# Patient Record
Sex: Male | Born: 1935 | Race: White | Hispanic: No | Marital: Married | Smoking: Current every day smoker
Health system: Southern US, Community
[De-identification: ages and names within clinical notes are randomized; demographics above are authoritative.]

## PROBLEM LIST (undated history)

## (undated) DIAGNOSIS — I2510 Atherosclerotic heart disease of native coronary artery without angina pectoris: Secondary | ICD-10-CM

## (undated) DIAGNOSIS — E119 Type 2 diabetes mellitus without complications: Secondary | ICD-10-CM

## (undated) DIAGNOSIS — E785 Hyperlipidemia, unspecified: Secondary | ICD-10-CM

## (undated) DIAGNOSIS — I10 Essential (primary) hypertension: Secondary | ICD-10-CM

## (undated) HISTORY — DX: Essential (primary) hypertension: I10

## (undated) HISTORY — DX: Type 2 diabetes mellitus without complications: E11.9

## (undated) HISTORY — DX: Hyperlipidemia, unspecified: E78.5

## (undated) HISTORY — DX: Atherosclerotic heart disease of native coronary artery without angina pectoris: I25.10

## (undated) HISTORY — PX: APPENDECTOMY: SHX54

## (undated) HISTORY — PX: CORONARY ARTERY BYPASS GRAFT: SHX141

---

## 1997-09-18 ENCOUNTER — Encounter (HOSPITAL_COMMUNITY): Admission: RE | Admit: 1997-09-18 | Discharge: 1997-12-17 | Payer: Self-pay | Admitting: Cardiovascular Disease

## 1997-12-16 ENCOUNTER — Encounter (HOSPITAL_COMMUNITY): Admission: RE | Admit: 1997-12-16 | Discharge: 1998-03-16 | Payer: Self-pay | Admitting: Cardiovascular Disease

## 1998-03-18 ENCOUNTER — Encounter (HOSPITAL_COMMUNITY): Admission: RE | Admit: 1998-03-18 | Discharge: 1998-06-16 | Payer: Self-pay | Admitting: Cardiovascular Disease

## 1998-06-17 ENCOUNTER — Encounter (HOSPITAL_COMMUNITY): Admission: RE | Admit: 1998-06-17 | Discharge: 1998-09-15 | Payer: Self-pay | Admitting: Cardiovascular Disease

## 1998-09-16 ENCOUNTER — Encounter (HOSPITAL_COMMUNITY): Admission: RE | Admit: 1998-09-16 | Discharge: 1998-12-15 | Payer: Self-pay | Admitting: Cardiovascular Disease

## 1998-12-16 ENCOUNTER — Encounter (HOSPITAL_COMMUNITY): Admission: RE | Admit: 1998-12-16 | Discharge: 1999-01-16 | Payer: Self-pay | Admitting: Cardiovascular Disease

## 1999-01-17 ENCOUNTER — Encounter (HOSPITAL_COMMUNITY): Admission: RE | Admit: 1999-01-17 | Discharge: 1999-04-17 | Payer: Self-pay | Admitting: Cardiovascular Disease

## 2001-12-14 ENCOUNTER — Encounter: Payer: Self-pay | Admitting: Emergency Medicine

## 2001-12-14 ENCOUNTER — Emergency Department (HOSPITAL_COMMUNITY): Admission: EM | Admit: 2001-12-14 | Discharge: 2001-12-14 | Payer: Self-pay | Admitting: Emergency Medicine

## 2004-06-21 ENCOUNTER — Ambulatory Visit (HOSPITAL_COMMUNITY): Admission: RE | Admit: 2004-06-21 | Discharge: 2004-06-21 | Payer: Self-pay | Admitting: Gastroenterology

## 2004-08-29 ENCOUNTER — Encounter: Admission: RE | Admit: 2004-08-29 | Discharge: 2004-08-29 | Payer: Self-pay | Admitting: General Surgery

## 2004-08-31 ENCOUNTER — Ambulatory Visit (HOSPITAL_COMMUNITY): Admission: RE | Admit: 2004-08-31 | Discharge: 2004-08-31 | Payer: Self-pay | Admitting: General Surgery

## 2004-08-31 ENCOUNTER — Ambulatory Visit (HOSPITAL_BASED_OUTPATIENT_CLINIC_OR_DEPARTMENT_OTHER): Admission: RE | Admit: 2004-08-31 | Discharge: 2004-08-31 | Payer: Self-pay | Admitting: General Surgery

## 2008-07-07 ENCOUNTER — Encounter: Admission: RE | Admit: 2008-07-07 | Discharge: 2008-07-07 | Payer: Self-pay | Admitting: Family Medicine

## 2009-03-08 ENCOUNTER — Emergency Department (HOSPITAL_COMMUNITY): Admission: EM | Admit: 2009-03-08 | Discharge: 2009-03-09 | Payer: Self-pay | Admitting: Emergency Medicine

## 2010-11-03 NOTE — Op Note (Signed)
NAMEAZLAN, HANWAY                 ACCOUNT NO.:  [id number]   MEDICAL RECORD NO.:  [id number]          PATIENT TYPE:  AMB   LOCATION:  ENDO                         FACILITY:  [facility]   PHYSICIAN:  Graylin Shiver, M.D.   DATE OF BIRTH:  12/16/1933   DATE OF PROCEDURE:  06/21/2004  DATE OF DISCHARGE:                                 OPERATIVE REPORT   PROCEDURE:  Colonoscopy.   ENDOSCOPIST:  Graylin Shiver, M.D.   INDICATIONS FOR PROCEDURE:  Screening.   INFORMED CONSENT:  An informed consent was obtained after the explanation of  the risks of bleeding, infection and perforation.   PREMEDICATION:  Fentanyl 45 mcg IV, Versed 4.5 mg IV.   DESCRIPTION OF PROCEDURE:  With the patient in the left lateral decubitus  position, a rectal examination was performed.  No masses were felt.  The  Olympus colonoscope was inserted into the rectum and advanced around the  colon to the cecum.  The cecal landmarks were identified.  The cecum and  ascending colon were normal.  The transverse colon showed an occasional  diverticulum.  The descending colon and sigmoid showed moderate  diverticulosis.  The rectum was normal.   He tolerated the procedure well without complications.   IMPRESSION:  Diverticulosis   RECOMMENDATIONS:  I would recommend a follow-up screening colonoscopy in 10  years.       SFG/MEDQ  D:  06/21/2004  T:  06/21/2004  Job:  [id number]   cc:   Donia Guiles, M.D.  [address] [place]  [place] [address]  Fax: [phone]

## 2010-11-03 NOTE — Op Note (Signed)
Juan Duncan, Juan Duncan                 ACCOUNT NO.:  [id number]   MEDICAL RECORD NO.:  [id number]          PATIENT TYPE:  AMB   LOCATION:  DSC                          FACILITY:  [facility]   PHYSICIAN:  Leonie Man, M.D.   DATE OF BIRTH:  29-Sep-1933   DATE OF PROCEDURE:  08/31/2004  DATE OF DISCHARGE:                                 OPERATIVE REPORT   PREOPERATIVE DIAGNOSIS:  Right inguinal hernia.   POSTOPERATIVE DIAGNOSIS:  Right inguinal hernia.   PROCEDURE:  Right inguinal hernia repair with mesh.   SURGEON:  Leonie Man, M.D.   ASSISTANT:  Nurse.   ANESTHESIA:  General.   INDICATIONS FOR PROCEDURE:  [name] is a 77 year old man presenting with  right-sided groin pain and noted to have a large right inguinal hernia,  referred by his physician for evaluation and repair.  The risks and  potential benefits of surgery have been discussed with the patient.  He  understands these and gives his consent to surgery.   PROCEDURE:  Following the induction of satisfactory general anesthesia, with  the patient positioned supinely.  The right groin is prepped and draped to  be included in a sterile operative field.  The groin crease infiltrated with  0.5% Marcaine with epinephrine and transverse incision made in the lower  groin crease, deepened through the skin and subcutaneous tissue, dissecting  down to the external oblique aponeurosis.  This opened up through the  external inguinal ring with protection of the ilioinguinal nerve.  Spermatic  cord was elevated and held with a Penrose drain.  There was no indirect  hernia noted.  A large direct hernia was noted in the rectus space and this  was repaired with a patch of polypropylene mesh and was sewn in at the pubic  tubercle, carried up along conjoined tendon to the internal ring with 2-0  Novofil suture and again from the pubic tubercle carried up along the  shelving edge of Poupart's ligament to the internal ring with a 2-0  Novofil  suture.  The tails of the mesh were split so as to allow normal protrusion  of the cord.  The tails of the mesh were then sutured down into the internal  oblique muscles behind the cord.  This creates a new internal ring which was  not constricting off the cord.  All areas of dissection were then checked  for hemostasis and noted to be dry.  Sponge and instrument counts were  verified.  External oblique aponeurosis closed over the cord with a running  2-0 Vicryl suture.  The Scarpa's fascia closed with a  running 3-0 Vicryl suture and skin closed with running 4-0 Monocryl suture  and then reinforced with Steri-Strips.  Sterile dressings applied.  Anesthetic reversed.  The patient removed from the operating room to the  recovery room in stable condition, tolerating the procedure well.      PB/MEDQ  D:  08/31/2004  T:  08/31/2004  Job:  [id number]   cc:   Donia Guiles, M.D.  [address] [place]  [place] [address]  Fax: [phone]

## 2010-11-03 NOTE — Consult Note (Signed)
Juan Duncan, Juan Duncan                 ACCOUNT NO.:  [id number]   MEDICAL RECORD NO.:  [id number]          PATIENT TYPE:  AMB   LOCATION:  DSC                          FACILITY:  [facility]   PHYSICIAN:  Leonie Man, M.D.   DATE OF BIRTH:  1933-08-16   DATE OF CONSULTATION:  08/31/2004  DATE OF DISCHARGE:                                   CONSULTATION   PROBLEM:  Right inguinal hernia.   The patient is a 78 year old retired man who is requested to be seen by his  primary care physician for a large right-sided inguinal hernia.  The patient  has no history of chronic constipation, no bladder neck obstruction, and  does not do any heavy lifting.  He noted the hernia after he was active and  noted a very large mass in his groin.  He comes in for evaluation and  treatment.   MEDICATIONS:  1.  Toprol XL 50 mg.  2.  Tricor 145 mg daily.  3.  Vytorin 10/40 mg daily.  4.  Aspirin 81 mg daily.   DRUG ALLERGIES:  No known drug allergies.   SURGERY:  1.  The patient has had CABG x 6 back in 1994.  2.  He has also had an appendectomy in the past.  3.  He had a split-thickness skin graft to his nose.   SOCIAL HISTORY:  The patient is a retired married man.  No tobacco use, no  alcohol use.   FAMILY HISTORY:  Mother and father are deceased.  Father died from heart  disease, otherwise unspecified.  His mother died from pancreatic cancer.  He  did have a brother who had an accidental death secondary to driving while  intoxicated.   REVIEW OF SYSTEMS:  Negative in detail, except for some history of reflux  esophagitis.  He is not currently on any antireflux medications.  He has had  a skin cancer removed from the bridge of his nose with a split-thickness  skin graft to this area.  He does complain of some arthritis.   PHYSICAL EXAMINATION:  GENERAL:  He is a well-developed, pleasant gentleman.  BLOOD PRESSURE:  134/72.  HEENT:  Head normocephalic.  Pupils round, regular.  No nasal  obstruction.  There is a well-healed split-thickness skin graft over the bridge of his  nose.  Oropharynx is benign.  NECK:  Supple.  No thyromegaly or adenopathy.  CHEST:  Clear to auscultation.  HEART:  Regular rate and rhythm, without murmurs or gallop rhythms.  ABDOMEN:  Soft, nontender, nondistended.  There is a rather large right-  sided inguinal hernia noted.  EXTREMITIES:  Range of motion within normal limits.  No clubbing, cyanosis,  or edema.  NEUROLOGIC:  Shows the patient to be alert and oriented x 3, moving all four  extremities, without any gross sensory deficits.   ASSESSMENT:  1.  Right inguinal hernia.  2.  Coronary artery disease, status post coronary artery bypass graft,      stable.  3.  Hypertension.   PLAN:  For repair of his right inguinal hernia with  mesh.  The risks and  potential benefits of surgery have been discussed with the patient.  All  questions answered and consent obtained.   NOTE:  [facility] medical record number for this patient is  [phone].      PB/MEDQ  D:  08/31/2004  T:  08/31/2004  Job:  [id number]

## 2011-06-19 HISTORY — PX: NM MYOVIEW LTD: HXRAD82

## 2012-10-13 ENCOUNTER — Other Ambulatory Visit (HOSPITAL_COMMUNITY): Payer: Self-pay | Admitting: Cardiovascular Disease

## 2012-10-13 DIAGNOSIS — I25810 Atherosclerosis of coronary artery bypass graft(s) without angina pectoris: Secondary | ICD-10-CM

## 2012-11-04 ENCOUNTER — Encounter (HOSPITAL_COMMUNITY): Payer: Self-pay | Admitting: Cardiovascular Disease

## 2012-11-20 ENCOUNTER — Encounter (HOSPITAL_COMMUNITY): Payer: Self-pay

## 2013-01-31 ENCOUNTER — Other Ambulatory Visit (HOSPITAL_COMMUNITY): Payer: Self-pay | Admitting: Cardiovascular Disease

## 2013-02-02 NOTE — Telephone Encounter (Signed)
Rx was sent to pharmacy electronically. 

## 2013-02-15 ENCOUNTER — Other Ambulatory Visit (HOSPITAL_COMMUNITY): Payer: Self-pay | Admitting: Cardiovascular Disease

## 2013-02-17 NOTE — Telephone Encounter (Signed)
Rx was sent to pharmacy electronically. 

## 2013-09-18 ENCOUNTER — Encounter (HOSPITAL_COMMUNITY): Payer: Self-pay | Admitting: *Deleted

## 2013-10-01 ENCOUNTER — Telehealth (HOSPITAL_COMMUNITY): Payer: Self-pay

## 2013-10-06 ENCOUNTER — Ambulatory Visit (HOSPITAL_COMMUNITY)
Admission: RE | Admit: 2013-10-06 | Discharge: 2013-10-06 | Disposition: A | Discharge status: Home / Self Care | Payer: Medicare Other | Source: Ambulatory Visit | Attending: Cardiology | Admitting: Cardiology

## 2013-10-06 DIAGNOSIS — I25810 Atherosclerosis of coronary artery bypass graft(s) without angina pectoris: Secondary | ICD-10-CM | POA: Insufficient documentation

## 2013-10-06 MED ORDER — REGADENOSON 0.4 MG/5ML IV SOLN
0.4000 mg | Freq: Once | INTRAVENOUS | Status: AC
  Administered 2013-10-06: 0.4 mg via INTRAVENOUS

## 2013-10-06 MED ORDER — TECHNETIUM TC 99M SESTAMIBI GENERIC - CARDIOLITE
30.0000 | Freq: Once | INTRAVENOUS | Status: AC | PRN
  Administered 2013-10-06: 30 via INTRAVENOUS

## 2013-10-06 MED ORDER — TECHNETIUM TC 99M SESTAMIBI GENERIC - CARDIOLITE
10.0000 | Freq: Once | INTRAVENOUS | Status: AC | PRN
  Administered 2013-10-06: 10 via INTRAVENOUS

## 2013-10-06 NOTE — Procedures (Addendum)
[facility] [address] [place] [address] [phone]  Cardiology Nuclear Med Study  Juan BarthelGrover C Wenzler is a 80 y.o. male     MRN : [id number]     DOB: 07-Apr-1934  Procedure Date: 10/06/2013  Nuclear Med Background Indication for Stress Test:  Graft Patency and Stent Patency History:  CAD;CABG X6;STENT/PTCA;Last NUC MPI study on 10/03/2011-nonischemic;EF=64% Cardiac Risk Factors: Family History - CAD, Hypertension, LBBB, Lipids, NIDDM and Smoker  Symptoms:  Pt denies symptoms at this time.   Nuclear Pre-Procedure Caffeine/Decaff Intake:  8:00pm NPO After: 6:00am   IV Site: R Hand  IV 0.9% NS with Angio Cath:  22g  Chest Size (in):  36"  IV Started by: Berdie OgrenAmanda Wease, RN  Height: 5' (1.524 m)  Cup Size: n/a  BMI:  Body mass index is 28.51 kg/(m^2). Weight:  146 lb (66.225 kg)   Tech Comments:  n/a    Nuclear Med Study 1 or 2 day study: 1 day  Stress Test Type:  Lexiscan  Order Authorizing Provider:  Nanetta BattyJonathan Berry, MD   Resting Radionuclide: Technetium 6871m Sestamibi  Resting Radionuclide Dose: 10.6 mCi   Stress Radionuclide:  Technetium 7571m Sestamibi  Stress Radionuclide Dose: 30.2 mCi           Stress Protocol Rest HR: 51 Stress HR: 73  Rest BP: 167/79 Stress BP: 153/80  Exercise Time (min): n/a METS: n/a   Predicted Max HR: 143 bpm % Max HR: 59.44 bpm Rate Pressure Product: [id number]  Dose of Adenosine (mg):  n/a Dose of Lexiscan: 0.4 mg  Dose of Atropine (mg): n/a Dose of Dobutamine: n/a mcg/kg/min (at max HR)  Stress Test Technologist: Esperanza Sheetserry-Marie Martin, CCT Nuclear Technologist: Koren Shiverobin Moffitt, CNMT   Rest Procedure:  Myocardial perfusion imaging was performed at rest 45 minutes following the intravenous administration of Technetium 6071m Sestamibi. Stress Procedure:  The patient received IV Lexiscan 0.4 mg over 15-seconds.  Technetium 5071m Sestamibi injected IV at 30-seconds.  There were no significant changes  with Lexiscan.  Quantitative spect images were obtained after a 45 minute delay.  Transient Ischemic Dilatation (Normal <1.22):  1.02 Lung/Heart Ratio (Normal <0.45):  0.31 QGS EDV:  54 ml QGS ESV:  14 ml LV Ejection Fraction: 73%       Rest ECG: NSR-LBBB, nonspecific T wave changes  Stress ECG: No significant ST segment change suggestive of ischemia.  QPS Raw Data Images:  Mild diaphragmatic attenuation.  Normal left ventricular size. Stress Images:  There is decreased uptake in the inferior wall.  Rest Images:  Comparison with the stress images reveals partial improvement in the inferior wall perfusion. Subtraction (SDS):  There is a fixed inferior defect that is most consistent with diaphragmatic attenuation. There is also superimposed ischemia LV Wall Motion:  NL LV Function; NL Wall Motion  Impression Exercise Capacity:  Lexiscan with no exercise. BP Response:  Normal blood pressure response. Clinical Symptoms:  No significant symptoms noted. ECG Impression:  No significant ECG changes with Lexiscan. Comparison with Prior Nuclear Study: Compared with the previous report there is a new inferior wall area of reversible ischemia.   Overall Impression:  Intermediate risk stress nuclear study with a limited area of inferior wall ischemia, superimposed on a diaphragmatic attenuation artifact. Inferior wall motion and overall LV systolic function is normal.   Thurmon FairMihai Jos Cygan, MD  10/06/2013 6:21 PM

## 2013-10-09 ENCOUNTER — Telehealth: Payer: Self-pay | Admitting: *Deleted

## 2013-10-09 NOTE — Telephone Encounter (Signed)
Patient walked in with a request for a handicapp placard.  Dr Allyson SabalBerry signed the form and I mailed it to patient.

## 2013-10-13 ENCOUNTER — Ambulatory Visit: Payer: Self-pay | Admitting: Cardiovascular Disease

## 2013-10-20 ENCOUNTER — Encounter: Payer: Self-pay | Admitting: Cardiovascular Disease

## 2013-10-20 ENCOUNTER — Ambulatory Visit (INDEPENDENT_AMBULATORY_CARE_PROVIDER_SITE_OTHER): Payer: Medicare Other | Admitting: Cardiovascular Disease

## 2013-10-20 VITALS — BP 146/76 | HR 66 | Ht 60.5 in | Wt 145.9 lb

## 2013-10-20 DIAGNOSIS — I10 Essential (primary) hypertension: Secondary | ICD-10-CM

## 2013-10-20 DIAGNOSIS — I2510 Atherosclerotic heart disease of native coronary artery without angina pectoris: Secondary | ICD-10-CM

## 2013-10-20 DIAGNOSIS — E785 Hyperlipidemia, unspecified: Secondary | ICD-10-CM

## 2013-10-20 NOTE — Progress Notes (Signed)
10/20/2013 Juan Duncan   10/24/1933  [id number]  Primary Physician Cam HaiSHAW, KIMBERLY, CNM Primary Cardiologist: Runell GessJonathan J. Berry MD Roseanne RenoFACP,FACC,FAHA, FSCAI  .  HPI:  The patient is a very pleasant 80 year old, mildly overweight, married Caucasian male, father of 2, grandfather to 3 grandchildren who I saw one year ago.Marland Kitchen. He has a history of CAD status post coronary artery bypass grafting in 1994. He did suffer an acute anterior wall myocardial infarction with LAD PCI subsequent to bypass grafting. His other problems include hypertension, hyperlipidemia, noninsulin-requiring diabetes and chronic right bundle-branch block. He denies chest pain or shortness of breath. He had a recent Myoview stress test that suggested mild to moderate inferobasal ischemia or prior Myoview study was a suggestion of this as well. His lipid profile is followed by his PCP.   Current Outpatient Prescriptions  Medication Sig Dispense Refill  . aspirin EC 81 MG tablet Take 81 mg by mouth daily.      . fenofibrate (TRICOR) 145 MG tablet TAKE 1 TABLET DAILY  90 tablet  3  . glipiZIDE (GLUCOTROL) 5 MG tablet Take 1 tablet by mouth daily.      . insulin aspart (NOVOLOG FLEXPEN) 100 UNIT/ML FlexPen Inject 10 Units into the skin daily.      Marland Kitchen. LANTUS SOLOSTAR 100 UNIT/ML Solostar Pen Inject 18-20 Units into the skin every evening.      Marland Kitchen. lisinopril (PRINIVIL,ZESTRIL) 5 MG tablet Take 1 tablet by mouth daily.      . ONGLYZA 2.5 MG TABS tablet Take 1 tablet by mouth daily.      . TOPROL XL 50 MG 24 hr tablet Take 1 tablet by mouth daily.      Marland Kitchen. VYTORIN 10-40 MG per tablet TAKE 1 TABLET DAILY  90 tablet  2   No current facility-administered medications for this visit.    No Known Allergies  History   Social History  . Marital Status: Single    Spouse Name: N/A    Number of Children: N/A  . Years of Education: N/A   Occupational History  . Not on file.   Social History Main Topics  . Smoking status: Current  Every Day Smoker -- 0.50 packs/day for 65 years    Types: Cigarettes  . Smokeless tobacco: Never Used  . Alcohol Use: Not on file  . Drug Use: Not on file  . Sexual Activity: Not on file   Other Topics Concern  . Not on file   Social History Narrative  . No narrative on file     Review of Systems: General: negative for chills, fever, night sweats or weight changes.  Cardiovascular: negative for chest pain, dyspnea on exertion, edema, orthopnea, palpitations, paroxysmal nocturnal dyspnea or shortness of breath Dermatological: negative for rash Respiratory: negative for cough or wheezing Urologic: negative for hematuria Abdominal: negative for nausea, vomiting, diarrhea, bright red blood per rectum, melena, or hematemesis Neurologic: negative for visual changes, syncope, or dizziness All other systems reviewed and are otherwise negative except as noted above.    Blood pressure 146/76, pulse 66, height 5' 0.5" (1.537 m), weight 145 lb 14.4 oz (66.18 kg).  General appearance: alert and no distress Neck: no adenopathy, no carotid bruit, no JVD, supple, symmetrical, trachea midline and thyroid not enlarged, symmetric, no tenderness/mass/nodules Lungs: clear to auscultation bilaterally Heart: regular rate and rhythm, S1, S2 normal, no murmur, click, rub or gallop Extremities: extremities normal, atraumatic, no cyanosis or edema  EKG normal sinus rhythm at  66 with right bundle branch block and inferior Q waves versus left anterior fascicular block.  ASSESSMENT AND PLAN:   Coronary artery disease Status post anterolateral infarction in 1994 treated with PCI with subsequent bypass grafting. He denies chest pain or shortness of breath. A recent Myoview did show mild to moderate inferobasal ischemia. When compared to prior Myoview stress test there was a suggestion of inferobasal ischemia however this seems more prominent. It is completely asymptomatic I favor a more conservative approach  with watchful waiting.  Essential hypertension Controlled on current medications  Hyperlipidemia On statin therapy followed by his PCP      Runell GessJonathan J. Berry MD [facility] InstituteFACP,FACC,FAHA, [facility] AlbuquerqueFSCAI 10/20/2013 9:53 AM

## 2013-10-20 NOTE — Patient Instructions (Signed)
Your physician wants you to follow-up in: 1 year with Dr Berry. You will receive a reminder letter in the mail two months in advance. If you don't receive a letter, please call our office to schedule the follow-up appointment.  

## 2013-10-20 NOTE — Assessment & Plan Note (Signed)
On statin therapy followed by his PCP 

## 2013-10-20 NOTE — Assessment & Plan Note (Signed)
Status post anterolateral infarction in 1994 treated with PCI with subsequent bypass grafting. He denies chest pain or shortness of breath. A recent Myoview did show mild to moderate inferobasal ischemia. When compared to prior Myoview stress test there was a suggestion of inferobasal ischemia however this seems more prominent. It is completely asymptomatic I favor a more conservative approach with watchful waiting.

## 2013-10-20 NOTE — Assessment & Plan Note (Signed)
Controlled on current medications 

## 2013-11-16 ENCOUNTER — Telehealth: Payer: Self-pay | Admitting: Cardiovascular Disease

## 2013-11-16 MED ORDER — LISINOPRIL 5 MG PO TABS: 5.0000 mg | ORAL_TABLET | Freq: Every day | ORAL | Status: DC

## 2013-11-16 MED ORDER — EZETIMIBE-SIMVASTATIN 10-40 MG PO TABS: 1.0000 | ORAL_TABLET | Freq: Every day | ORAL | Status: DC

## 2013-11-16 NOTE — Telephone Encounter (Signed)
Spoke with patient and informed him the two Rx refills had been sent to his pharmacy. Patient voiced understanding.

## 2013-11-16 NOTE — Telephone Encounter (Signed)
Pt have been unable to get his medicine from [facility]. Please call in his Vytorin 10/40 #90, and Lisinopril 5 mg #90. Please call this in today to [facility].

## 2013-12-31 NOTE — Telephone Encounter (Signed)
Encounter complete. 

## 2014-02-13 ENCOUNTER — Other Ambulatory Visit (HOSPITAL_COMMUNITY): Payer: Self-pay | Admitting: Cardiovascular Disease

## 2014-02-15 NOTE — Telephone Encounter (Signed)
Rx refill sent to patient pharmacy   

## 2014-08-30 ENCOUNTER — Encounter: Payer: Self-pay | Admitting: Neurology

## 2014-08-30 ENCOUNTER — Ambulatory Visit (INDEPENDENT_AMBULATORY_CARE_PROVIDER_SITE_OTHER): Payer: Medicare Other | Admitting: Neurology

## 2014-08-30 VITALS — BP 110/54 | HR 63 | Resp 16 | Ht 61.5 in | Wt 140.0 lb

## 2014-08-30 DIAGNOSIS — R413 Other amnesia: Secondary | ICD-10-CM

## 2014-08-30 DIAGNOSIS — E785 Hyperlipidemia, unspecified: Secondary | ICD-10-CM

## 2014-08-30 DIAGNOSIS — E119 Type 2 diabetes mellitus without complications: Secondary | ICD-10-CM

## 2014-08-30 DIAGNOSIS — I10 Essential (primary) hypertension: Secondary | ICD-10-CM

## 2014-08-30 LAB — TSH: TSH: 1.779 u[IU]/mL (ref 0.350–4.500)

## 2014-08-30 LAB — VITAMIN B12: Vitamin B-12: 763 pg/mL (ref 211–911)

## 2014-08-30 MED ORDER — DONEPEZIL HCL 10 MG PO TABS: ORAL_TABLET | ORAL | Status: DC
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
## 2014-08-30 NOTE — Patient Instructions (Signed)
1. Bloodwork for TSH, B12 2. Schedule MRI brain without contrast 3. Start Aricept 10mg : Take 1/2 tablet daily for 1 week, then increase to 1 tablet daily 4. Physical exercise and brain stimulation exercises are important for brain health 5. Follow-up in 3 months, call our office for any problems

## 2014-08-31 ENCOUNTER — Encounter: Payer: Self-pay | Admitting: Neurology

## 2014-08-31 DIAGNOSIS — R413 Other amnesia: Secondary | ICD-10-CM | POA: Insufficient documentation

## 2014-08-31 DIAGNOSIS — E119 Type 2 diabetes mellitus without complications: Secondary | ICD-10-CM | POA: Insufficient documentation

## 2014-09-02 ENCOUNTER — Other Ambulatory Visit: Payer: Self-pay | Admitting: Family Medicine

## 2014-09-02 ENCOUNTER — Telehealth: Payer: Self-pay | Admitting: Neurology

## 2014-09-02 DIAGNOSIS — R413 Other amnesia: Secondary | ICD-10-CM

## 2014-09-02 MED ORDER — DONEPEZIL HCL 10 MG PO TABS: ORAL_TABLET | ORAL | Status: DC

## 2014-09-02 NOTE — Telephone Encounter (Signed)
Sybil, pt's spouse called/returning your call at 1:21PM. C/B [phone]

## 2014-09-02 NOTE — Telephone Encounter (Signed)
Returned call. Let her know that patient's Rx has been filled by the pharmacy. They ran the correct Rx card for patient's Aricept Rx & was approved. PA was not needed.

## 2014-09-15 ENCOUNTER — Ambulatory Visit (HOSPITAL_COMMUNITY)
Admission: RE | Admit: 2014-09-15 | Discharge: 2014-09-15 | Disposition: A | Discharge status: Home / Self Care | Payer: Medicare Other | Source: Ambulatory Visit | Attending: Neurology | Admitting: Neurology

## 2014-09-15 ENCOUNTER — Encounter: Payer: Self-pay | Admitting: *Deleted

## 2014-09-15 DIAGNOSIS — I739 Peripheral vascular disease, unspecified: Secondary | ICD-10-CM | POA: Insufficient documentation

## 2014-09-15 DIAGNOSIS — G319 Degenerative disease of nervous system, unspecified: Secondary | ICD-10-CM | POA: Diagnosis not present

## 2014-09-15 DIAGNOSIS — R413 Other amnesia: Secondary | ICD-10-CM | POA: Diagnosis present

## 2014-09-16 ENCOUNTER — Telehealth: Payer: Self-pay | Admitting: Family Medicine

## 2014-09-16 NOTE — Telephone Encounter (Signed)
-----   Message from Van ClinesKaren M Aquino, MD sent at 09/16/2014  8:22 AM EDT ----- Pls let him know I reviewed MRI brain, no evidence of tumor, stroke, or bleed. It shows age-related changes. Thanks

## 2014-09-16 NOTE — Telephone Encounter (Signed)
No answer, will try again.

## 2014-09-20 NOTE — Telephone Encounter (Signed)
Patient was notified of results.  

## 2014-09-21 ENCOUNTER — Telehealth: Payer: Self-pay | Admitting: Neurology

## 2014-09-21 NOTE — Telephone Encounter (Signed)
I returned call. Patient's wife wanted to verify that the MRI scan was normal patient was notified of his results yesterday. I did tell her there were no abnormalities seen.

## 2014-09-21 NOTE — Telephone Encounter (Signed)
Pt wife Gillis EndsSybil called and wants to know the test results please call [phone]

## 2014-09-29 ENCOUNTER — Other Ambulatory Visit: Payer: Self-pay | Admitting: *Deleted

## 2014-09-29 MED ORDER — LISINOPRIL 5 MG PO TABS: 5.0000 mg | ORAL_TABLET | Freq: Every day | ORAL | Status: DC

## 2014-10-19 ENCOUNTER — Encounter: Payer: Self-pay | Admitting: Cardiovascular Disease

## 2014-11-04 ENCOUNTER — Other Ambulatory Visit: Payer: Self-pay | Admitting: Cardiovascular Disease

## 2014-11-04 NOTE — Telephone Encounter (Signed)
Rx(s) sent to pharmacy electronically.  

## 2014-11-30 ENCOUNTER — Encounter: Payer: Self-pay | Admitting: Neurology

## 2014-11-30 ENCOUNTER — Ambulatory Visit (INDEPENDENT_AMBULATORY_CARE_PROVIDER_SITE_OTHER): Payer: Medicare Other | Admitting: Neurology

## 2014-11-30 VITALS — BP 130/74 | HR 58 | Ht 61.5 in | Wt 137.1 lb

## 2014-11-30 DIAGNOSIS — G3184 Mild cognitive impairment, so stated: Secondary | ICD-10-CM

## 2014-11-30 MED ORDER — RIVASTIGMINE TARTRATE 1.5 MG PO CAPS: ORAL_CAPSULE | ORAL | Status: DC
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
## 2014-11-30 NOTE — Patient Instructions (Signed)
1. Stop Donepezil 2. Once diarrhea and stomach issues resolve, wait another week, then start Exelon 1.5mg : Take 1 tablet daily for 1 week, then increase to 1 tablet twice a day 3. Continue monitor mood 4. Follow-up in 4 months, call for any problems  Recommended Books:  1) The 36-Hour Day: A Family Guide to Caring for People Who Have Alzheimer Disease, Related Dementias, and Memory Loss (5th edition) by Lelon Huh. Mace and Bettye Boeck Rabins  2) Understanding Difficult Behaviors: Some practical suggestions for coping with Alzheimer's disease and related illnesses by Laurine Blazer and Ricardo Jericho  3) A Caregiver's Guide to Dementia: Using Activities and Other Strategies to Prevent, Reduce, and Manage Behavioral Symptoms by Mahala Menghini Bethena Midget and [employer]

## 2014-11-30 NOTE — Progress Notes (Signed)
Note sent

## 2014-12-14 ENCOUNTER — Other Ambulatory Visit: Payer: Self-pay | Admitting: Cardiovascular Disease

## 2014-12-14 NOTE — Telephone Encounter (Signed)
Rx(s) sent to pharmacy electronically.  

## 2014-12-15 ENCOUNTER — Encounter: Payer: Self-pay | Admitting: Cardiovascular Disease

## 2014-12-15 ENCOUNTER — Ambulatory Visit (INDEPENDENT_AMBULATORY_CARE_PROVIDER_SITE_OTHER): Payer: Medicare Other | Admitting: Cardiovascular Disease

## 2014-12-15 VITALS — BP 132/62 | HR 58 | Ht 61.5 in | Wt 137.3 lb

## 2014-12-15 DIAGNOSIS — I2510 Atherosclerotic heart disease of native coronary artery without angina pectoris: Secondary | ICD-10-CM | POA: Diagnosis not present

## 2014-12-15 DIAGNOSIS — E785 Hyperlipidemia, unspecified: Secondary | ICD-10-CM | POA: Diagnosis not present

## 2014-12-15 DIAGNOSIS — I2583 Coronary atherosclerosis due to lipid rich plaque: Secondary | ICD-10-CM

## 2014-12-15 DIAGNOSIS — I10 Essential (primary) hypertension: Secondary | ICD-10-CM | POA: Diagnosis not present

## 2014-12-15 NOTE — Assessment & Plan Note (Signed)
History of hypertension with blood pressure measured at 132/62. He is on lisinopril and Toprol. Continue current meds at current dosing

## 2014-12-15 NOTE — Assessment & Plan Note (Signed)
History of coronary artery disease status post bypass grafting in 1994. He did suffer an acute anterior wall myocardial infarction with LAD PCI subsequent bypass grafting. Myoview stress test performed a year ago that showed a limited area of inferior wall ischemia superimposed on diaphragmatic attenuation artifact. It was elected to treat him medically since he was asymptomatic.

## 2014-12-15 NOTE — Patient Instructions (Signed)
Dr Berry recommends that you schedule a follow-up appointment in 1 year. You will receive a reminder letter in the mail two months in advance. If you don't receive a letter, please call our office to schedule the follow-up appointment. 

## 2014-12-15 NOTE — Progress Notes (Signed)
12/15/2014 Juan Duncan   08/22/1933  [id number]  Primary Physician Cam Hai, CNM Primary Cardiologist: Runell Gess MD Juan Duncan   HPI:  The patient is a very pleasant 81 year old, mildly overweight, married Caucasian male, father of 2, grandfather to 3 grandchildren who I saw one year ago.Marland Kitchen He is accompanied by his wife today.He has a history of CAD status post coronary artery bypass grafting in 1994. He did suffer an acute anterior wall myocardial infarction with LAD PCI subsequent to bypass grafting. His other problems include hypertension, hyperlipidemia, noninsulin-requiring diabetes and chronic right bundle-branch block. He denies chest pain or shortness of breath. He had a Myoview stress test performed 10/06/13  that suggested mild to moderate inferobasal ischemia or prior Myoview study was a suggestion of this as well. His lipid profile is followed by his PCP which was done on 10/18/14 revealing a toe pressure 1:30, LDL 51 and HDL of 36   Current Outpatient Prescriptions  Medication Sig Dispense Refill  . aspirin EC 81 MG tablet Take 81 mg by mouth daily.    . fenofibrate (TRICOR) 145 MG tablet Take 1 tablet (145 mg total) by mouth daily. 90 tablet 3  . glipiZIDE (GLUCOTROL) 5 MG tablet Take 1 tablet by mouth daily.    Marland Kitchen LANTUS SOLOSTAR 100 UNIT/ML Solostar Pen Inject 25 Units into the skin daily.     Marland Kitchen lisinopril (PRINIVIL,ZESTRIL) 5 MG tablet Take 1 tablet (5 mg total) by mouth daily. 90 tablet 0  . Omega-3 Fatty Acids (FISH OIL) 1000 MG CAPS Take by mouth. Take 2 capsules daily    . rivastigmine (EXELON) 1.5 MG capsule Take 1 tablet daily for 1 week, then increase to 1 tablet twice a day 60 capsule 3  . TOPROL XL 50 MG 24 hr tablet Take 1 tablet by mouth daily.    . TRADJENTA 5 MG TABS tablet 5 mg. Take 1 tablet daily    . VYTORIN 10-40 MG per tablet TAKE 1 TABLET DAILY 90 tablet 2   No current facility-administered medications for this visit.    No  Known Allergies  History   Social History  . Marital Status: Married    Spouse Name: N/A  . Number of Children: 2  . Years of Education: N/A   Occupational History  . Retired    Social History Main Topics  . Smoking status: Current Every Day Smoker -- 0.50 packs/day for 65 years    Types: Cigarettes  . Smokeless tobacco: Never Used  . Alcohol Use: No  . Drug Use: No  . Sexual Activity: Not on file   Other Topics Concern  . Not on file   Social History Narrative   Lives with wife in a one story home.  Has 2 children.  Retired from [employer].  Education: high school.     Review of Systems: General: negative for chills, fever, night sweats or weight changes.  Cardiovascular: negative for chest pain, dyspnea on exertion, edema, orthopnea, palpitations, paroxysmal nocturnal dyspnea or shortness of breath Dermatological: negative for rash Respiratory: negative for cough or wheezing Urologic: negative for hematuria Abdominal: negative for nausea, vomiting, diarrhea, bright red blood per rectum, melena, or hematemesis Neurologic: negative for visual changes, syncope, or dizziness All other systems reviewed and are otherwise negative except as noted above.    Blood pressure 132/62, pulse 58, height 5' 1.5" (1.562 m), weight 137 lb 4.8 oz (62.279 kg).  General appearance: alert and no distress Neck: no  adenopathy, no carotid bruit, no JVD, supple, symmetrical, trachea midline and thyroid not enlarged, symmetric, no tenderness/mass/nodules Lungs: clear to auscultation bilaterally Heart: regular rate and rhythm, S1, S2 normal, no murmur, click, rub or gallop Extremities: extremities normal, atraumatic, no cyanosis or edema  EKG sinus bradycardia 58 with bifascicular block. I personally reviewed his EKG  ASSESSMENT AND PLAN:   Hyperlipidemia History of hyperlipidemia on Vytorin 10/40 with recent blood work performed 10/18/14 revealing a total cholesterol 1:30, LDL of 51 and HDL of  36  Essential hypertension History of hypertension with blood pressure measured at 132/62. He is on lisinopril and Toprol. Continue current meds at current dosing  Coronary artery disease History of coronary artery disease status post bypass grafting in 1994. He did suffer an acute anterior wall myocardial infarction with LAD PCI subsequent bypass grafting. Myoview stress test performed a year ago that showed a limited area of inferior wall ischemia superimposed on diaphragmatic attenuation artifact. It was elected to treat him medically since he was asymptomatic.      Runell GessJonathan J. Berry MD FACP,FACC,FAHA, [facility] 12/15/2014 9:56 AM

## 2014-12-15 NOTE — Assessment & Plan Note (Signed)
History of hyperlipidemia on Vytorin 10/40 with recent blood work performed 10/18/14 revealing a total cholesterol 1:30, LDL of 51 and HDL of 36

## 2014-12-25 ENCOUNTER — Other Ambulatory Visit: Payer: Self-pay | Admitting: Cardiovascular Disease

## 2015-04-01 ENCOUNTER — Ambulatory Visit (INDEPENDENT_AMBULATORY_CARE_PROVIDER_SITE_OTHER): Payer: Medicare Other | Admitting: Neurology

## 2015-04-01 ENCOUNTER — Encounter: Payer: Self-pay | Admitting: Neurology

## 2015-04-01 VITALS — BP 98/52 | HR 58 | Ht 61.5 in | Wt 138.0 lb

## 2015-04-01 DIAGNOSIS — I10 Essential (primary) hypertension: Secondary | ICD-10-CM | POA: Diagnosis not present

## 2015-04-01 DIAGNOSIS — F0390 Unspecified dementia without behavioral disturbance: Secondary | ICD-10-CM

## 2015-04-01 DIAGNOSIS — E785 Hyperlipidemia, unspecified: Secondary | ICD-10-CM | POA: Diagnosis not present

## 2015-04-01 DIAGNOSIS — Z794 Long term (current) use of insulin: Secondary | ICD-10-CM

## 2015-04-01 DIAGNOSIS — E119 Type 2 diabetes mellitus without complications: Secondary | ICD-10-CM | POA: Diagnosis not present

## 2015-04-01 DIAGNOSIS — F03A0 Unspecified dementia, mild, without behavioral disturbance, psychotic disturbance, mood disturbance, and anxiety: Secondary | ICD-10-CM

## 2015-04-01 MED ORDER — RIVASTIGMINE TARTRATE 3 MG PO CAPS: 3.0000 mg | ORAL_CAPSULE | Freq: Two times a day (BID) | ORAL | Status: DC

## 2015-04-01 NOTE — Patient Instructions (Signed)
1. Increase Exelon to 3mg  twice a day 2. Physical exercise and brain stimulation exercises are important for brain health 3. Follow-up in 6 months
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
## 2015-04-04 DIAGNOSIS — E119 Type 2 diabetes mellitus without complications: Secondary | ICD-10-CM | POA: Insufficient documentation

## 2015-04-04 DIAGNOSIS — F03A0 Unspecified dementia, mild, without behavioral disturbance, psychotic disturbance, mood disturbance, and anxiety: Secondary | ICD-10-CM | POA: Insufficient documentation

## 2015-04-04 DIAGNOSIS — F0390 Unspecified dementia without behavioral disturbance: Secondary | ICD-10-CM | POA: Insufficient documentation

## 2015-04-04 DIAGNOSIS — Z794 Long term (current) use of insulin: Secondary | ICD-10-CM

## 2015-09-12 ENCOUNTER — Encounter (HOSPITAL_COMMUNITY): Payer: Self-pay | Admitting: *Deleted

## 2015-09-12 ENCOUNTER — Emergency Department (HOSPITAL_COMMUNITY)
Admission: EM | Admit: 2015-09-12 | Discharge: 2015-09-12 | Disposition: A | Discharge status: Home / Self Care | Payer: Medicare Other | Attending: Emergency Medicine | Admitting: Emergency Medicine

## 2015-09-12 ENCOUNTER — Emergency Department (HOSPITAL_COMMUNITY): Payer: Medicare Other

## 2015-09-12 DIAGNOSIS — Z794 Long term (current) use of insulin: Secondary | ICD-10-CM | POA: Diagnosis not present

## 2015-09-12 DIAGNOSIS — R51 Headache: Secondary | ICD-10-CM | POA: Insufficient documentation

## 2015-09-12 DIAGNOSIS — I2510 Atherosclerotic heart disease of native coronary artery without angina pectoris: Secondary | ICD-10-CM | POA: Insufficient documentation

## 2015-09-12 DIAGNOSIS — E785 Hyperlipidemia, unspecified: Secondary | ICD-10-CM | POA: Diagnosis not present

## 2015-09-12 DIAGNOSIS — R42 Dizziness and giddiness: Secondary | ICD-10-CM | POA: Insufficient documentation

## 2015-09-12 DIAGNOSIS — Z7982 Long term (current) use of aspirin: Secondary | ICD-10-CM | POA: Insufficient documentation

## 2015-09-12 DIAGNOSIS — Z79899 Other long term (current) drug therapy: Secondary | ICD-10-CM | POA: Diagnosis not present

## 2015-09-12 DIAGNOSIS — F17210 Nicotine dependence, cigarettes, uncomplicated: Secondary | ICD-10-CM | POA: Diagnosis not present

## 2015-09-12 DIAGNOSIS — E119 Type 2 diabetes mellitus without complications: Secondary | ICD-10-CM | POA: Insufficient documentation

## 2015-09-12 DIAGNOSIS — I10 Essential (primary) hypertension: Secondary | ICD-10-CM | POA: Insufficient documentation

## 2015-09-12 DIAGNOSIS — Z951 Presence of aortocoronary bypass graft: Secondary | ICD-10-CM | POA: Diagnosis not present

## 2015-09-12 LAB — COMPREHENSIVE METABOLIC PANEL
ALT: 24 U/L (ref 17–63)
AST: 40 U/L (ref 15–41)
Albumin: 2.9 g/dL — ABNORMAL LOW (ref 3.5–5.0)
Alkaline Phosphatase: 49 U/L (ref 38–126)
Anion gap: 9 (ref 5–15)
BILIRUBIN TOTAL: 0.3 mg/dL (ref 0.3–1.2)
BUN: 34 mg/dL — AB (ref 6–20)
CO2: 23 mmol/L (ref 22–32)
CREATININE: 1.76 mg/dL — AB (ref 0.61–1.24)
Calcium: 9.4 mg/dL (ref 8.9–10.3)
Chloride: 102 mmol/L (ref 101–111)
GFR, EST AFRICAN AMERICAN: 41 mL/min — AB (ref >60)
GFR, EST NON AFRICAN AMERICAN: 35 mL/min — AB (ref >60)
Glucose, Bld: 217 mg/dL — ABNORMAL HIGH (ref 65–99)
POTASSIUM: 4.6 mmol/L (ref 3.5–5.1)
Sodium: 134 mmol/L — ABNORMAL LOW (ref 135–145)
TOTAL PROTEIN: 6.7 g/dL (ref 6.5–8.1)

## 2015-09-12 LAB — I-STAT TROPONIN, ED: TROPONIN I, POC: 0.03 ng/mL (ref 0.00–0.08)

## 2015-09-12 LAB — DIFFERENTIAL
BASOS ABS: 0 10*3/uL (ref 0.0–0.1)
Basophils Relative: 0 %
EOS ABS: 0.1 10*3/uL (ref 0.0–0.7)
Eosinophils Relative: 2 %
LYMPHS ABS: 2.4 10*3/uL (ref 0.7–4.0)
Lymphocytes Relative: 47 %
MONO ABS: 0.4 10*3/uL (ref 0.1–1.0)
MONOS PCT: 8 %
Neutro Abs: 2.2 10*3/uL (ref 1.7–7.7)
Neutrophils Relative %: 43 %

## 2015-09-12 LAB — CBC
HCT: 41.8 % (ref 39.0–52.0)
HEMOGLOBIN: 13.8 g/dL (ref 13.0–17.0)
MCH: 29.9 pg (ref 26.0–34.0)
MCHC: 33 g/dL (ref 30.0–36.0)
MCV: 90.7 fL (ref 78.0–100.0)
Platelets: 120 10*3/uL — ABNORMAL LOW (ref 150–400)
RBC: 4.61 MIL/uL (ref 4.22–5.81)
RDW: 14.2 % (ref 11.5–15.5)
WBC: 5.1 10*3/uL (ref 4.0–10.5)

## 2015-09-12 LAB — PROTIME-INR
INR: 0.99 (ref 0.00–1.49)
Prothrombin Time: 13.3 seconds (ref 11.6–15.2)

## 2015-09-12 LAB — APTT: APTT: 32 s (ref 24–37)

## 2015-09-12 NOTE — ED Provider Notes (Signed)
CSN: [id number]     Arrival date & time 09/12/15  1549 History   First MD Initiated Contact with Patient 09/12/15 2031     Chief Complaint  Patient presents with  . Headache  . Dizziness     HPI 82 y.o. male with history of coronary artery disease, hypertension, hyperlipidemia, type 2 diabetes, who presents status post an episode of "feeling unbalanced on my feet" as well as blurry vision that occurred earlier in the day and has completely resolved. The patient reports that he was at home with his granddaughter, when he had difficulty seeing the TV "because my vision became "blurry." When he stood up to walk, he states that he felt like he had to lean to the side in order to maintain his balance. He denies falling, sensation of the room spinning, or lightheadedness like he was going to pass out. Denies difficulty speaking, weakness, sensory deficits. The patient does have dementia and some memory loss but his wife is with him today and corroborates the story. Denies recent illnesses. Does report a mild frontal headache earlier in the day that also has completely resolved. Denies history of stroke.   Past Medical History  Diagnosis Date  . Coronary artery disease   . Hypertension   . Hyperlipidemia   . Type 2 diabetes mellitus [facility](HCC)    Past Surgical History  Procedure Laterality Date  . Appendectomy    . Coronary artery bypass graft      x 6  . Nm myoview ltd  2013   Family History  Problem Relation Age of Onset  . Heart attack Father   . Cancer Mother    Social History  Substance Use Topics  . Smoking status: Current Every Day Smoker -- 0.50 packs/day for 65 years    Types: Cigarettes  . Smokeless tobacco: Never Used  . Alcohol Use: No    Review of Systems  Constitutional: Negative for fever, chills, activity change and appetite change.  HENT: Negative for congestion, facial swelling, rhinorrhea and sore throat.   Eyes: Positive for visual disturbance (resolved).   Respiratory: Negative for cough, shortness of breath and wheezing.   Cardiovascular: Negative for chest pain, palpitations and leg swelling.  Gastrointestinal: Negative for nausea, vomiting, abdominal pain, diarrhea, constipation and blood in stool.  Genitourinary: Negative for dysuria, frequency, hematuria, flank pain and difficulty urinating.  Musculoskeletal: Negative for myalgias, back pain, joint swelling, arthralgias, neck pain and neck stiffness.  Skin: Negative for rash.  Neurological: Positive for headaches (resolved). Negative for dizziness, seizures, syncope, speech difficulty, weakness, light-headedness and numbness.       "unsteadiness," resolved  Psychiatric/Behavioral: Negative for behavioral problems, confusion and agitation.      Allergies  Review of patient's allergies indicates no known allergies.  Home Medications   Prior to Admission medications   Medication Sig Start Date End Date Taking? Authorizing Provider  aspirin EC 81 MG tablet Take 81 mg by mouth daily.    Historical Provider, MD  fenofibrate (TRICOR) 145 MG tablet Take 1 tablet (145 mg total) by mouth daily. 11/04/14   Runell GessJonathan J Berry, MD  glipiZIDE (GLUCOTROL) 5 MG tablet Take 1 tablet by mouth daily. 08/24/13   Historical Provider, MD  LANTUS SOLOSTAR 100 UNIT/ML Solostar Pen Inject 25 Units into the skin daily.  08/30/14   Historical Provider, MD  Omega-3 Fatty Acids (FISH OIL) 1000 MG CAPS Take by mouth. Take 2 capsules daily    Historical Provider, MD  rivastigmine (EXELON) 3  MG capsule Take 1 capsule (3 mg total) by mouth 2 (two) times daily. 04/01/15   Van Clines, MD  TOPROL XL 50 MG 24 hr tablet Take 1 tablet by mouth daily. 07/26/13   Historical Provider, MD  TRADJENTA 5 MG TABS tablet 5 mg. Take 1 tablet daily 08/19/14   Historical Provider, MD  VYTORIN 10-40 MG per tablet TAKE 1 TABLET DAILY 12/14/14   Runell Gess, MD   BP 119/56 mmHg  Pulse 51  Temp(Src) 97.8 F (36.6 C) (Oral)  Resp 18   SpO2 96% Physical Exam  Constitutional: He is oriented to person, place, and time. He appears well-developed and well-nourished. No distress.  HENT:  Head: Normocephalic and atraumatic.  Right Ear: External ear normal.  Left Ear: External ear normal.  Nose: Nose normal.  Mouth/Throat: Oropharynx is clear and moist. No oropharyngeal exudate.  Eyes: Conjunctivae are normal. Pupils are equal, round, and reactive to light. Right eye exhibits no discharge. Left eye exhibits no discharge. No scleral icterus.  Neck: Normal range of motion. Neck supple. No tracheal deviation present.  Cardiovascular: Normal rate, regular rhythm and normal heart sounds.  Exam reveals no gallop and no friction rub.   No murmur heard. Pulmonary/Chest: Effort normal and breath sounds normal. No respiratory distress. He has no wheezes. He has no rales.  Abdominal: Soft. Bowel sounds are normal. He exhibits no distension and no mass. There is no tenderness. There is no rebound and no guarding.  Musculoskeletal: Normal range of motion. He exhibits no edema or tenderness.  Neurological: He is alert and oriented to person, place, and time. He exhibits normal muscle tone.  Face symmetric, tongue midline. 5/5 strength in the proximal and distal upper and lower extremities bilaterally, with intact sensation to light touch. Normal finger to nose, heel to shin, and rapid alternating movements. Normal speech. Normal gait.    Skin: Skin is warm and dry. No rash noted. He is not diaphoretic.  Psychiatric: He has a normal mood and affect. His behavior is normal. Judgment and thought content normal.    ED Course  Procedures (including critical care time) Labs Review Labs Reviewed  CBC - Abnormal; Notable for the following:    Platelets 120 (*)    All other components within normal limits  COMPREHENSIVE METABOLIC PANEL - Abnormal; Notable for the following:    Sodium 134 (*)    Glucose, Bld 217 (*)    BUN 34 (*)    Creatinine,  Ser 1.76 (*)    Albumin 2.9 (*)    GFR calc non Af Amer 35 (*)    GFR calc Af Amer 41 (*)    All other components within normal limits  PROTIME-INR  APTT  DIFFERENTIAL  I-STAT TROPOININ, ED    Imaging Review Ct Head Wo Contrast  09/12/2015  CLINICAL DATA:  Stroke symptoms. Headache with increased blurred vision and unsteady gait today. History of dementia. EXAM: CT HEAD WITHOUT CONTRAST TECHNIQUE: Contiguous axial images were obtained from the base of the skull through the vertex without intravenous contrast. COMPARISON:  MRI brain 09/15/2014. FINDINGS: Brain: There is no evidence of acute intracranial hemorrhage, mass lesion, brain edema or extra-axial fluid collection. The ventricles and subarachnoid spaces are prominent but stable. There is no CT evidence of acute cortical infarction. Confluent hypodensities within the periventricular and subcortical white matter are similar to previous MRI and consistent with chronic small vessel ischemic changes. Intracranial vascular calcifications are present. Bones/sinuses/visualized face: The visualized paranasal  sinuses, mastoid air cells and middle ears are clear. The calvarium is intact. IMPRESSION: 1. No acute intracranial findings demonstrated. No evidence of acute hemorrhage. 2. Atrophy and chronic small vessel ischemic changes in the periventricular and subcortical white matter, similar to previous MRI. Electronically Signed   By: Carey Bullocks M.D.   On: 09/12/2015 16:57   I have personally reviewed and evaluated these images and lab results as part of my medical decision-making.   EKG Interpretation   Date/Time:  Monday September 12 2015 16:11:35 EDT Ventricular Rate:  56 PR Interval:  202 QRS Duration: 124 QT Interval:  458 QTC Calculation: 441 R Axis:   -75 Text Interpretation:  Sinus bradycardia Left axis deviation Right bundle  branch block Possible Lateral infarct , age undetermined Inferior infarct  , age undetermined Abnormal ECG  Sinus bradycardia Left axis deviation  Right bundle branch block T wave abnormality No significant change since  last tracing Abnormal ekg Confirmed by Gerhard Munch  MD [phone]) on  09/12/2015 9:09:41 PM      MDM   Final diagnoses:  Dizziness   Patient is generally well-appearing. Neuro exam as above with no deficits. CT head with no acute intracranial abnormalities. As the patient's symptoms have completely resolved, I doubt stroke at this time. It is possible that his symptoms are secondary to a TIA. Patient is currently taking 81 mg of aspirin daily, and recommend that he continue taking this as prescribed. Recommend follow-up with his primary care doctor within 1 week for further TIA workup. Patient's wife reports that she will be able to arrange this appointment. Patient has sinus bradycardia in the ED, but EKG is unchanged from prior. He denies any chest pain or lightheadedness. Patient has an acute kidney injury, with creatinine of 1.76, but his wife reports that he is currently being worked up for this by his primary care physician. Remainder of laboratory workup is unremarkable. He stable for discharge home, with strict precautions to return to the ED immediately for recurrence of symptoms.   Jenifer Ernestina Penna, MD 09/12/15 1191  Gerhard Munch, MD 09/13/15 [phone]

## 2015-09-12 NOTE — Discharge Instructions (Signed)

## 2015-09-12 NOTE — ED Notes (Signed)
Per family member, pt has memory loss and dementia. Pt has reported headache and increase in blurred vision today. Family reports noticing increase in unsteady gait today. Hard to assess and pinpoint LSN due to dementia.

## 2015-09-12 NOTE — ED Notes (Signed)
Patient was stable and ambulatory.  Patient and family verbalized understanding of discharge instructions

## 2015-09-25 ENCOUNTER — Other Ambulatory Visit: Payer: Self-pay | Admitting: Cardiovascular Disease

## 2015-09-26 NOTE — Telephone Encounter (Signed)
REFILL 

## 2015-09-28 ENCOUNTER — Encounter: Payer: Self-pay | Admitting: Neurology

## 2015-09-28 ENCOUNTER — Ambulatory Visit (INDEPENDENT_AMBULATORY_CARE_PROVIDER_SITE_OTHER): Payer: Medicare Other | Admitting: Neurology

## 2015-09-28 VITALS — BP 126/66 | HR 57 | Ht 61.5 in | Wt 133.0 lb

## 2015-09-28 DIAGNOSIS — F03A0 Unspecified dementia, mild, without behavioral disturbance, psychotic disturbance, mood disturbance, and anxiety: Secondary | ICD-10-CM

## 2015-09-28 DIAGNOSIS — I10 Essential (primary) hypertension: Secondary | ICD-10-CM | POA: Diagnosis not present

## 2015-09-28 DIAGNOSIS — F0390 Unspecified dementia without behavioral disturbance: Secondary | ICD-10-CM

## 2015-09-28 DIAGNOSIS — E785 Hyperlipidemia, unspecified: Secondary | ICD-10-CM | POA: Diagnosis not present

## 2015-09-28 DIAGNOSIS — Z794 Long term (current) use of insulin: Secondary | ICD-10-CM

## 2015-09-28 DIAGNOSIS — E119 Type 2 diabetes mellitus without complications: Secondary | ICD-10-CM
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
## 2015-09-28 NOTE — Patient Instructions (Signed)
1. Continue Exelon 3mg  twice a day 2. Control of blood pressure, cholesterol, diabetes, as well as physical exercise and brain stimulation exercises are important for brain health 3. Follow-up in 6 months

## 2015-09-30 ENCOUNTER — Ambulatory Visit: Payer: Medicare Other | Admitting: Neurology

## 2015-12-16 ENCOUNTER — Ambulatory Visit (INDEPENDENT_AMBULATORY_CARE_PROVIDER_SITE_OTHER): Payer: Medicare Other | Admitting: Cardiovascular Disease

## 2015-12-16 ENCOUNTER — Encounter: Payer: Self-pay | Admitting: Cardiovascular Disease

## 2015-12-16 VITALS — BP 140/68 | HR 60 | Ht 61.0 in | Wt 133.0 lb

## 2015-12-16 DIAGNOSIS — I10 Essential (primary) hypertension: Secondary | ICD-10-CM

## 2015-12-16 DIAGNOSIS — E785 Hyperlipidemia, unspecified: Secondary | ICD-10-CM

## 2015-12-16 DIAGNOSIS — I2510 Atherosclerotic heart disease of native coronary artery without angina pectoris: Secondary | ICD-10-CM

## 2015-12-16 DIAGNOSIS — I2583 Coronary atherosclerosis due to lipid rich plaque: Principal | ICD-10-CM

## 2015-12-16 NOTE — Assessment & Plan Note (Signed)
History of hypertension blood pressure measured at 140/68. He is on Toprol. Continue current meds at current dosing

## 2015-12-16 NOTE — Assessment & Plan Note (Signed)
History of hyperlipidemia on Vytorin and fenofibrate followed by his PCP

## 2015-12-16 NOTE — Assessment & Plan Note (Signed)
History of coronary artery disease status post anterior wall microinfarcts in 1994 treated with LAD PCI and subsequent bypass grafting that year. He has done well since. His last Myoview performed 10/06/13 suggested mild-to-moderate inferobasal ischemia similar to prior stress test although he is completely asymptomatic.

## 2015-12-16 NOTE — Patient Instructions (Signed)
Medication Instructions:  Your physician recommends that you continue on your current medications as directed. Please refer to the Current Medication list given to you today.   Labwork: Labwork will be requested from your primary care physician.   Follow-Up: Your physician wants you to follow-up in: 12 MONTHS WITH DR BERRY. You will receive a reminder letter in the mail two months in advance. If you don't receive a letter, please call our office to schedule the follow-up appointment.   Any Other Special Instructions Will Be Listed Below (If Applicable).     If you need a refill on your cardiac medications before your next appointment, please call your pharmacy.   

## 2015-12-16 NOTE — Progress Notes (Signed)
12/16/2015 Barbaraann BarthelGrover C Quizon   80-24-1935  [id number]  Primary Physician Cam HaiSHAW, KIMBERLY, CNM Primary Cardiologist: Runell GessJonathan J Brittiany Wiehe MD Roseanne RenoFACP, FACC, FAHA, FSCAI  HPI:  The patient is a very pleasant 82 year old, mildly overweight, married Caucasian male, father of 2, grandfather to 3 grandchildren who I saw 12/15/14.Marland Kitchen. He is accompanied by his wife today who is also a patient of mine..He has a history of CAD status post coronary artery bypass grafting in 1994. He did suffer an acute anterior wall myocardial infarction with LAD PCI subsequent to bypass grafting. His other problems include hypertension, hyperlipidemia, noninsulin-requiring diabetes and chronic right bundle-branch block. He denies chest pain or shortness of breath. He had a Myoview stress test performed 10/06/13 that suggested mild to moderate inferobasal ischemia or prior Myoview study was a suggestion of this as well. His lipid profile is followed by his PCP which was done on 10/18/14 revealing a toe pressure 1:30, LDL 51 and HDL of 36   Current Outpatient Prescriptions  Medication Sig Dispense Refill  . aspirin EC 81 MG tablet Take 81 mg by mouth daily.    . fenofibrate (TRICOR) 145 MG tablet Take 1 tablet (145 mg total) by mouth daily. 90 tablet 3  . glipiZIDE (GLUCOTROL) 5 MG tablet Take 1 tablet by mouth daily.    Marland Kitchen. LANTUS SOLOSTAR 100 UNIT/ML Solostar Pen Inject 15 Units into the skin daily.     . Omega-3 Fatty Acids (FISH OIL) 1000 MG CAPS Take by mouth. Take 2 capsules daily    . rivastigmine (EXELON) 3 MG capsule Take 1 capsule (3 mg total) by mouth 2 (two) times daily. 180 capsule 3  . TOPROL XL 50 MG 24 hr tablet Take 1 tablet by mouth daily.    . TRADJENTA 5 MG TABS tablet 5 mg. Take 1 tablet daily    . VYTORIN 10-40 MG tablet TAKE 1 TABLET DAILY 90 tablet 0   No current facility-administered medications for this visit.    Allergies  Allergen Reactions  . Lisinopril Other (See Comments)    RENAL FAILURE.  Marland Kitchen. Aricept  [Donepezil Hcl] Other (See Comments)    MOODINESS  . Welchol [Colesevelam Hcl] Nausea Only    Social History   Social History  . Marital Status: Married    Spouse Name: N/A  . Number of Children: 2  . Years of Education: N/A   Occupational History  . Retired    Social History Main Topics  . Smoking status: Current Every Day Smoker -- 0.50 packs/day for 65 years    Types: Cigarettes  . Smokeless tobacco: Never Used  . Alcohol Use: No  . Drug Use: No  . Sexual Activity: Not on file   Other Topics Concern  . Not on file   Social History Narrative   Lives with wife in a one story home.  Has 2 children.  Retired from [employer].  Education: high school.     Review of Systems: General: negative for chills, fever, night sweats or weight changes.  Cardiovascular: negative for chest pain, dyspnea on exertion, edema, orthopnea, palpitations, paroxysmal nocturnal dyspnea or shortness of breath Dermatological: negative for rash Respiratory: negative for cough or wheezing Urologic: negative for hematuria Abdominal: negative for nausea, vomiting, diarrhea, bright red blood per rectum, melena, or hematemesis Neurologic: negative for visual changes, syncope, or dizziness All other systems reviewed and are otherwise negative except as noted above.    Blood pressure 140/68, pulse 60, height 5\' 1"  (1.549 m),  weight 133 lb (60.328 kg).  General appearance: alert and no distress Neck: no adenopathy, no carotid bruit, no JVD, supple, symmetrical, trachea midline and thyroid not enlarged, symmetric, no tenderness/mass/nodules Lungs: clear to auscultation bilaterally Heart: regular rate and rhythm, S1, S2 normal, no murmur, click, rub or gallop Extremities: extremities normal, atraumatic, no cyanosis or edema  EKG not performed today  ASSESSMENT AND PLAN:   Coronary artery disease History of coronary artery disease status post anterior wall microinfarcts in 1994 treated with LAD PCI and  subsequent bypass grafting that year. He has done well since. His last Myoview performed 10/06/13 suggested mild-to-moderate inferobasal ischemia similar to prior stress test although he is completely asymptomatic.  Essential hypertension History of hypertension blood pressure measured at 140/68. He is on Toprol. Continue current meds at current dosing  Hyperlipidemia History of hyperlipidemia on Vytorin and fenofibrate followed by his PCP      Runell GessJonathan J. Sakshi Sermons MD [facility] HospitalFACP,FACC,FAHA, [facility] LLCFSCAI 12/16/2015 9:30 AM

## 2015-12-25 ENCOUNTER — Other Ambulatory Visit: Payer: Self-pay | Admitting: Cardiovascular Disease

## 2016-01-02 ENCOUNTER — Other Ambulatory Visit: Payer: Self-pay

## 2016-01-02 MED ORDER — FENOFIBRATE 145 MG PO TABS: 145.0000 mg | ORAL_TABLET | Freq: Every day | ORAL | Status: AC

## 2016-01-06 ENCOUNTER — Other Ambulatory Visit: Payer: Self-pay | Admitting: *Deleted

## 2016-01-06 MED ORDER — EZETIMIBE-SIMVASTATIN 10-40 MG PO TABS
1.0000 | ORAL_TABLET | Freq: Every day | ORAL | Status: AC
Start: 2016-01-06 — End: ?

## 2016-03-12 ENCOUNTER — Other Ambulatory Visit: Payer: Self-pay | Admitting: Neurology

## 2016-03-12 NOTE — Telephone Encounter (Signed)
Rx sent to pharmacy   

## 2016-03-28 ENCOUNTER — Ambulatory Visit (INDEPENDENT_AMBULATORY_CARE_PROVIDER_SITE_OTHER): Payer: Medicare Other | Admitting: Neurology

## 2016-03-28 ENCOUNTER — Encounter: Payer: Self-pay | Admitting: Neurology

## 2016-03-28 VITALS — BP 140/68 | HR 59 | Temp 98.0°F | Ht 61.0 in | Wt 130.3 lb

## 2016-03-28 DIAGNOSIS — F0390 Unspecified dementia without behavioral disturbance: Secondary | ICD-10-CM | POA: Diagnosis not present

## 2016-03-28 DIAGNOSIS — F03A0 Unspecified dementia, mild, without behavioral disturbance, psychotic disturbance, mood disturbance, and anxiety: Secondary | ICD-10-CM

## 2016-03-28 MED ORDER — MEMANTINE HCL 10 MG PO TABS: 10.0000 mg | ORAL_TABLET | Freq: Two times a day (BID) | ORAL | 3 refills | Status: AC

## 2016-03-28 MED ORDER — RIVASTIGMINE TARTRATE 3 MG PO CAPS: 3.0000 mg | ORAL_CAPSULE | Freq: Two times a day (BID) | ORAL | 3 refills | Status: AC

## 2016-03-28 NOTE — Patient Instructions (Signed)
1. Start Namenda 10mg : Take 1 tablet daily for 1 week, then increase to 1 tablet twice a day 2. Continue Exelon 3mg : Take 1 capsule twice a day 3. Continue to monitor mood 4. Control of blood pressure, cholesterol, and diabetes, as well as physical exercise and brain stimulation exercises are important for brain health 5. Follow-up in 6 months, call for any changes
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
## 2016-06-12 ENCOUNTER — Emergency Department (HOSPITAL_COMMUNITY): Payer: Medicare Other

## 2016-06-12 ENCOUNTER — Emergency Department (HOSPITAL_COMMUNITY)
Admission: EM | Admit: 2016-06-12 | Discharge: 2016-06-13 | Discharge status: Against Medical Advice | Payer: Medicare Other | Attending: Emergency Medicine | Admitting: Emergency Medicine

## 2016-06-12 ENCOUNTER — Encounter (HOSPITAL_COMMUNITY): Payer: Self-pay

## 2016-06-12 DIAGNOSIS — Z951 Presence of aortocoronary bypass graft: Secondary | ICD-10-CM | POA: Diagnosis not present

## 2016-06-12 DIAGNOSIS — Z7984 Long term (current) use of oral hypoglycemic drugs: Secondary | ICD-10-CM | POA: Insufficient documentation

## 2016-06-12 DIAGNOSIS — E119 Type 2 diabetes mellitus without complications: Secondary | ICD-10-CM | POA: Diagnosis not present

## 2016-06-12 DIAGNOSIS — R4182 Altered mental status, unspecified: Secondary | ICD-10-CM | POA: Diagnosis present

## 2016-06-12 DIAGNOSIS — F17210 Nicotine dependence, cigarettes, uncomplicated: Secondary | ICD-10-CM | POA: Insufficient documentation

## 2016-06-12 DIAGNOSIS — I2510 Atherosclerotic heart disease of native coronary artery without angina pectoris: Secondary | ICD-10-CM | POA: Diagnosis not present

## 2016-06-12 DIAGNOSIS — I10 Essential (primary) hypertension: Secondary | ICD-10-CM | POA: Diagnosis not present

## 2016-06-12 DIAGNOSIS — R791 Abnormal coagulation profile: Secondary | ICD-10-CM | POA: Insufficient documentation

## 2016-06-12 DIAGNOSIS — Z7982 Long term (current) use of aspirin: Secondary | ICD-10-CM | POA: Insufficient documentation

## 2016-06-12 DIAGNOSIS — G458 Other transient cerebral ischemic attacks and related syndromes: Secondary | ICD-10-CM | POA: Insufficient documentation

## 2016-06-12 LAB — CBC
HEMATOCRIT: 36.5 % — AB (ref 39.0–52.0)
HEMOGLOBIN: 12.4 g/dL — AB (ref 13.0–17.0)
MCH: 30.3 pg (ref 26.0–34.0)
MCHC: 34 g/dL (ref 30.0–36.0)
MCV: 89.2 fL (ref 78.0–100.0)
Platelets: 93 10*3/uL — ABNORMAL LOW (ref 150–400)
RBC: 4.09 MIL/uL — AB (ref 4.22–5.81)
RDW: 15.3 % (ref 11.5–15.5)
WBC: 4.7 10*3/uL (ref 4.0–10.5)

## 2016-06-12 LAB — PROTIME-INR
INR: 0.98
PROTHROMBIN TIME: 13 s (ref 11.4–15.2)

## 2016-06-12 LAB — I-STAT TROPONIN, ED: TROPONIN I, POC: 0.03 ng/mL (ref 0.00–0.08)

## 2016-06-12 LAB — APTT: APTT: 33 s (ref 24–36)

## 2016-06-12 LAB — CBG MONITORING, ED: GLUCOSE-CAPILLARY: 184 mg/dL — AB (ref 65–99)

## 2016-06-12 NOTE — ED Notes (Signed)
Pt transported to CT ?

## 2016-06-12 NOTE — ED Notes (Signed)
Pt returned from XR and connected to the monitor. Pt attempted to use the urinal and was unable to provide a sample.

## 2016-06-12 NOTE — ED Provider Notes (Signed)
[facility]-EMER[facility]ENCY DEPT Provider Note   CSN: [id number] Arrival date & time: 06/12/16  2047  History   Chief Complaint Chief Complaint  Patient presents with  . Altered Mental Status    HPI Juan Duncan is a 82 y.o. male.  HPI  Patient has past medical history of dementia, memory loss, diabetes, hypertension, hyperlipidemia, coronary artery disease comes in today for an episode of confusion. Wife states that she came home from the grocery store and he was sitting on the couch looking at his package of cigarettes unsure what to do with him. She is able to get him up walking to the kitchen to eat a sandwich but reports he was still not acting like himself. She called EMS per his PCP RN recommendations, and still had him take his medications and eat sandwich in case his blood sugar had dropped. When EMS arrived, CBG was 76. Wife reports that while waiting to be seen the patient is completely back to his baseline. She reports what happened tonight was a very unusual episode. He says that he feels fine and does not remember what happened, but wife reports this is normal. He has had cough recently. Pt denies chest pain, abdominal pain, n/v/d, fevers, weakness, change in vision. He had no difficulty eating sandwich.     Past Medical History:  Diagnosis Date  . Coronary artery disease   . Hyperlipidemia   . Hypertension   . Type 2 diabetes mellitus [facility](HCC)     Patient Active Problem List   Diagnosis Date Noted  . Mild dementia 04/04/2015  . Type 2 diabetes mellitus without complication, with long-term current use of insulin (HCC) 04/04/2015  . Type 2 diabetes mellitus without complication (HCC) 08/31/2014  . Memory loss 08/31/2014  . Coronary artery disease 10/20/2013  . Essential hypertension 10/20/2013  . Hyperlipidemia 10/20/2013    Past Surgical History:  Procedure Laterality Date  . APPENDECTOMY    . CORONARY ARTERY BYPASS GRAFT     x 6  . NM MYOVIEW LTD  2013       Home  Medications    Prior to Admission medications   Medication Sig Start Date End Date Taking? Authorizing Provider  aspirin EC 81 MG tablet Take 81 mg by mouth daily.   Yes Historical Provider, MD  ezetimibe-simvastatin (VYTORIN) 10-40 MG tablet Take 1 tablet by mouth daily. 01/06/16  Yes Runell GessJonathan J Berry, MD  fenofibrate (TRICOR) 145 MG tablet Take 1 tablet (145 mg total) by mouth daily. 01/02/16  Yes Runell GessJonathan J Berry, MD  glipiZIDE (GLUCOTROL) 5 MG tablet Take 1 tablet by mouth daily. 08/24/13  Yes Historical Provider, MD  LANTUS SOLOSTAR 100 UNIT/ML Solostar Pen Inject 15 Units into the skin daily.  08/30/14  Yes Historical Provider, MD  memantine (NAMENDA) 10 MG tablet Take 1 tablet (10 mg total) by mouth 2 (two) times daily. 03/28/16  Yes Van ClinesKaren M Aquino, MD  Omega-3 Fatty Acids (FISH OIL) 1000 MG CAPS Take 1 capsule by mouth 2 (two) times daily. Take 2 capsules daily    Yes Historical Provider, MD  rivastigmine (EXELON) 3 MG capsule Take 1 capsule (3 mg total) by mouth 2 (two) times daily. 03/28/16  Yes Van ClinesKaren M Aquino, MD  TOPROL XL 50 MG 24 hr tablet Take 50 mg by mouth daily.  07/26/13  Yes Historical Provider, MD  TRADJENTA 5 MG TABS tablet Take 5 mg by mouth daily. Take 1 tablet daily 08/19/14  Yes Historical Provider, MD  Family History Family History  Problem Relation Age of Onset  . Cancer Mother   . Heart attack Father     Social History Social History  Substance Use Topics  . Smoking status: Current Every Day Smoker    Packs/day: 0.50    Years: 65.00    Types: Cigarettes  . Smokeless tobacco: Never Used  . Alcohol use No     Allergies   Lisinopril; Aricept [donepezil hcl]; and Welchol [colesevelam hcl]   Review of Systems Review of Systems  Review of Systems All other systems negative except as documented in the HPI. All pertinent positives and negatives as reviewed in the HPI.  Physical Exam Updated Vital Signs BP 160/66 (BP Location: Right Arm)   Pulse (!) 55    Resp 17   SpO2 97%   Physical Exam  Constitutional: He appears well-developed and well-nourished. No distress.  HENT:  Head: Normocephalic and atraumatic.  Eyes: Conjunctivae and EOM are normal. Pupils are equal, round, and reactive to light. No scleral icterus.  Neck: Normal range of motion. Neck supple.  Cardiovascular: Normal rate, regular rhythm, normal heart sounds and intact distal pulses.   Pulmonary/Chest: Effort normal. No respiratory distress. He has no wheezes. He has no rales. He exhibits no tenderness.  Abdominal: Soft. There is no tenderness.  Musculoskeletal: He exhibits no edema or tenderness.  Neurological: He is alert. He has normal strength. No cranial nerve deficit or sensory deficit. He displays a negative Romberg sign.  No facial paralysis or slurring of speech, patient is alert and oriented to self.  Skin: Skin is warm and dry. No petechiae, no purpura and no rash noted. He is not diaphoretic.  Psychiatric: His speech is not slurred. Cognition and memory are impaired.  Nursing note and vitals reviewed.    ED Treatments / Results  Labs (all labs ordered are listed, but only abnormal results are displayed) Labs Reviewed  CBC - Abnormal; Notable for the following:       Result Value   RBC 4.09 (*)    Hemoglobin 12.4 (*)    HCT 36.5 (*)    Platelets 93 (*)    All other components within normal limits  COMPREHENSIVE METABOLIC PANEL - Abnormal; Notable for the following:    Glucose, Bld 202 (*)    BUN 37 (*)    Creatinine, Ser 1.52 (*)    Calcium 8.8 (*)    Total Protein 5.8 (*)    Albumin 2.6 (*)    Alkaline Phosphatase 33 (*)    GFR calc non Af Amer 42 (*)    GFR calc Af Amer 48 (*)    All other components within normal limits  URINALYSIS, ROUTINE W REFLEX MICROSCOPIC - Abnormal; Notable for the following:    Glucose, UA 50 (*)    Hgb urine dipstick SMALL (*)    Protein, ur 100 (*)    All other components within normal limits  CBG MONITORING, ED -  Abnormal; Notable for the following:    Glucose-Capillary 184 (*)    All other components within normal limits  PROTIME-INR  APTT  TROPONIN I  I-STAT TROPOININ, ED    EKG  EKG Interpretation  Date/Time:  Tuesday June 12 2016 20:50:12 EST Ventricular Rate:  59 PR Interval:    QRS Duration: 129 QT Interval:  439 QTC Calculation: 435 R Axis:   -86 Text Interpretation:  Sinus rhythm Right bundle branch block Inferolateral infarct, age indeterminate Baseline wander in lead(s) II III  aVF No significant change since last tracing Confirmed by [facility]  MD, CHRISTOPHER [phone]) on 06/13/2016 12:00:43 AM       Radiology Dg Chest 2 View  Result Date: 06/13/2016 CLINICAL DATA:  Cough and altered mental status EXAM: CHEST  2 VIEW COMPARISON:  Chest radiograph 09/19/2011 FINDINGS: Unchanged cardiomegaly. Bibasilar linear opacities, likely atelectasis. No pleural effusion or pneumothorax. No pulmonary edema.   IMPRESSION: Unchanged cardiomegaly without pulmonary edema. Bilateral basilar predominant linear opacities, likely atelectasis. Electronically Signed   By: Deatra Robinson M.D.   On: 06/13/2016 00:04   Ct Head Wo Contrast  Result Date: 06/13/2016 CLINICAL DATA:  Altered mental status EXAM: CT HEAD WITHOUT CONTRAST TECHNIQUE: Contiguous axial images were obtained from the base of the skull through the vertex without intravenous contrast. COMPARISON:  Head CT 09/12/2015 FINDINGS: Brain: No mass lesion, intraparenchymal hemorrhage or extra-axial collection. No evidence of acute cortical infarct. There is periventricular hypoattenuation compatible with chronic microvascular disease. Unchanged atrophy without lobar predilection. Vascular: Atherosclerotic calcification of the vertebral and internal carotid arteries at the skullbase. Skull: Normal visualized skull base, calvarium and extracranial soft tissues. Sinuses/Orbits: No sinus fluid levels or advanced mucosal thickening. No mastoid  effusion. Normal orbits.  IMPRESSION: 1. Chronic microvascular disease and atrophy without acute intracranial abnormality. 2. Vertebral and internal carotid artery atherosclerosis. Electronically Signed   By: Deatra Robinson M.D.   On: 06/13/2016 00:13    Procedures Procedures (including critical care time)  Medications Ordered in ED Medications - No data to display   Initial Impression / Assessment and Plan / ED Course  I have reviewed the triage vital signs and the nursing notes.  Pertinent labs & imaging results that were available during my care of the patient were reviewed by me and considered in my medical decision making (see chart for details).  Clinical Course     Patients stroke vs infection work up has resulted reassuring, he may have possibly had a TIA. I recommend admission to the hospital for further testing and obs. The patients declines and chooses to go home. Dr. Blinda Leatherwood is aware of this, the wife says she wants to respect his wishes. Pt will sign out AMA - understands that he may go home and get worse and is potentially at risk for death.  Vitals:   30-Jun-2016 2300 06/13/16 0010  BP: 129/71 160/66  Pulse: (!) 58 (!) 55  Resp: 16 17     Final Clinical Impressions(s) / ED Diagnoses   Final diagnoses:  Other specified transient cerebral ischemias    New Prescriptions New Prescriptions   No medications on file     Marlon Pel, PA-C 06/13/16 0109    Gilda Crease, MD 06/13/16 [phone]

## 2016-06-12 NOTE — ED Notes (Signed)
Patient transported to X-ray 

## 2016-06-12 NOTE — ED Triage Notes (Signed)
Patient's family called 911 after the patient began "demonstrating some abnormal behavior". Family convinced patient to take nighttime medications and eat a sandwich and symptoms began to resolve. Medic unit on scene measured patient's CBG to be 76. EMS unit then arrived and during transport found CBG to be 209. BP 164/76, HR 56, SpO2 97%. Per family the patients symptoms have primarily resolved but they are concerned about his heart rhythm and lingering disorientation.

## 2016-06-13 ENCOUNTER — Telehealth: Payer: Self-pay

## 2016-06-13 ENCOUNTER — Emergency Department (HOSPITAL_COMMUNITY): Payer: Medicare Other

## 2016-06-13 LAB — URINALYSIS, ROUTINE W REFLEX MICROSCOPIC
BACTERIA UA: NONE SEEN
BILIRUBIN URINE: NEGATIVE
GLUCOSE, UA: 50 mg/dL — AB
KETONES UR: NEGATIVE mg/dL
LEUKOCYTES UA: NEGATIVE
NITRITE: NEGATIVE
PH: 6 (ref 5.0–8.0)
Protein, ur: 100 mg/dL — AB
Specific Gravity, Urine: 1.013 (ref 1.005–1.030)
Squamous Epithelial / LPF: NONE SEEN

## 2016-06-13 LAB — COMPREHENSIVE METABOLIC PANEL
ALT: 19 U/L (ref 17–63)
AST: 39 U/L (ref 15–41)
Albumin: 2.6 g/dL — ABNORMAL LOW (ref 3.5–5.0)
Alkaline Phosphatase: 33 U/L — ABNORMAL LOW (ref 38–126)
Anion gap: 7 (ref 5–15)
BILIRUBIN TOTAL: 0.4 mg/dL (ref 0.3–1.2)
BUN: 37 mg/dL — AB (ref 6–20)
CO2: 22 mmol/L (ref 22–32)
CREATININE: 1.52 mg/dL — AB (ref 0.61–1.24)
Calcium: 8.8 mg/dL — ABNORMAL LOW (ref 8.9–10.3)
Chloride: 107 mmol/L (ref 101–111)
GFR calc Af Amer: 48 mL/min — ABNORMAL LOW (ref >60)
GFR, EST NON AFRICAN AMERICAN: 42 mL/min — AB (ref >60)
GLUCOSE: 202 mg/dL — AB (ref 65–99)
Potassium: 3.8 mmol/L (ref 3.5–5.1)
Sodium: 136 mmol/L (ref 135–145)
TOTAL PROTEIN: 5.8 g/dL — AB (ref 6.5–8.1)

## 2016-06-13 LAB — TROPONIN I: Troponin I: <0.03 ng/mL (ref <0.03)

## 2016-06-13 NOTE — Telephone Encounter (Signed)
Patient's granddaughter called [facility] on 06/02/16 at 6:58. Initial Comment: Caller said her grandfather has dementia, was confused at taking his pills. His depth perception was off. He was trying to put his pills in his milk. He over-reaches for items he needs and starts over again to try to grasp. His hand- eye coordination is off and this is new. At 7:19 on-call provider called. Dr. Allena KatzPatel informed of pt's s/s: confusion, hand-eye coordination being off, not understanding normal routine: but no s/s of stroke, & she recommends he should be taken to ED for evaluation to r/o possible cerebellar stroke. Caller informed. Caller now states patients morning blood sugar was low and after he ate this PM is when he seemed to improve, so had blood sugar checked again now it was only 73 after having a sandwich and glass of milk for supper. Caller advised to still go to ED now. She agreed.

## 2016-06-13 NOTE — ED Provider Notes (Signed)
Patient presented to the ER with confusion. Patient had acute episode of confusion that lasted 1-2 hours earlier today. He is now back to his normal baseline. Wife did not notice any unilateral weakness during the episode. This has happened one time previously. He has never had full workup for this.  Face to face Exam: HEENT - PERRLA Lungs - CTAB Heart - RRR, no M/R/G Abd - S/NT/ND Neuro - alert, oriented x3  Plan: Suspect TIA. Will require hospitalization for TIA workup.   Gilda Creasehristopher J Jayceon Troy, MD 06/13/16 [phone]

## 2016-06-13 NOTE — ED Notes (Signed)
Pt returned from CT and connected to the monitor 

## 2016-08-16 DEATH — deceased

## 2016-10-09 ENCOUNTER — Ambulatory Visit: Payer: Medicare Other | Admitting: Neurology

## 2017-04-11 IMAGING — CR DG CHEST 2V
2 series · 2 of 2 positions shown · non-contrast
Comparison: Chest radiograph 09/19/2011

CLINICAL DATA: Cough and altered mental status

EXAM:
CHEST  2 VIEW

[chest lat]
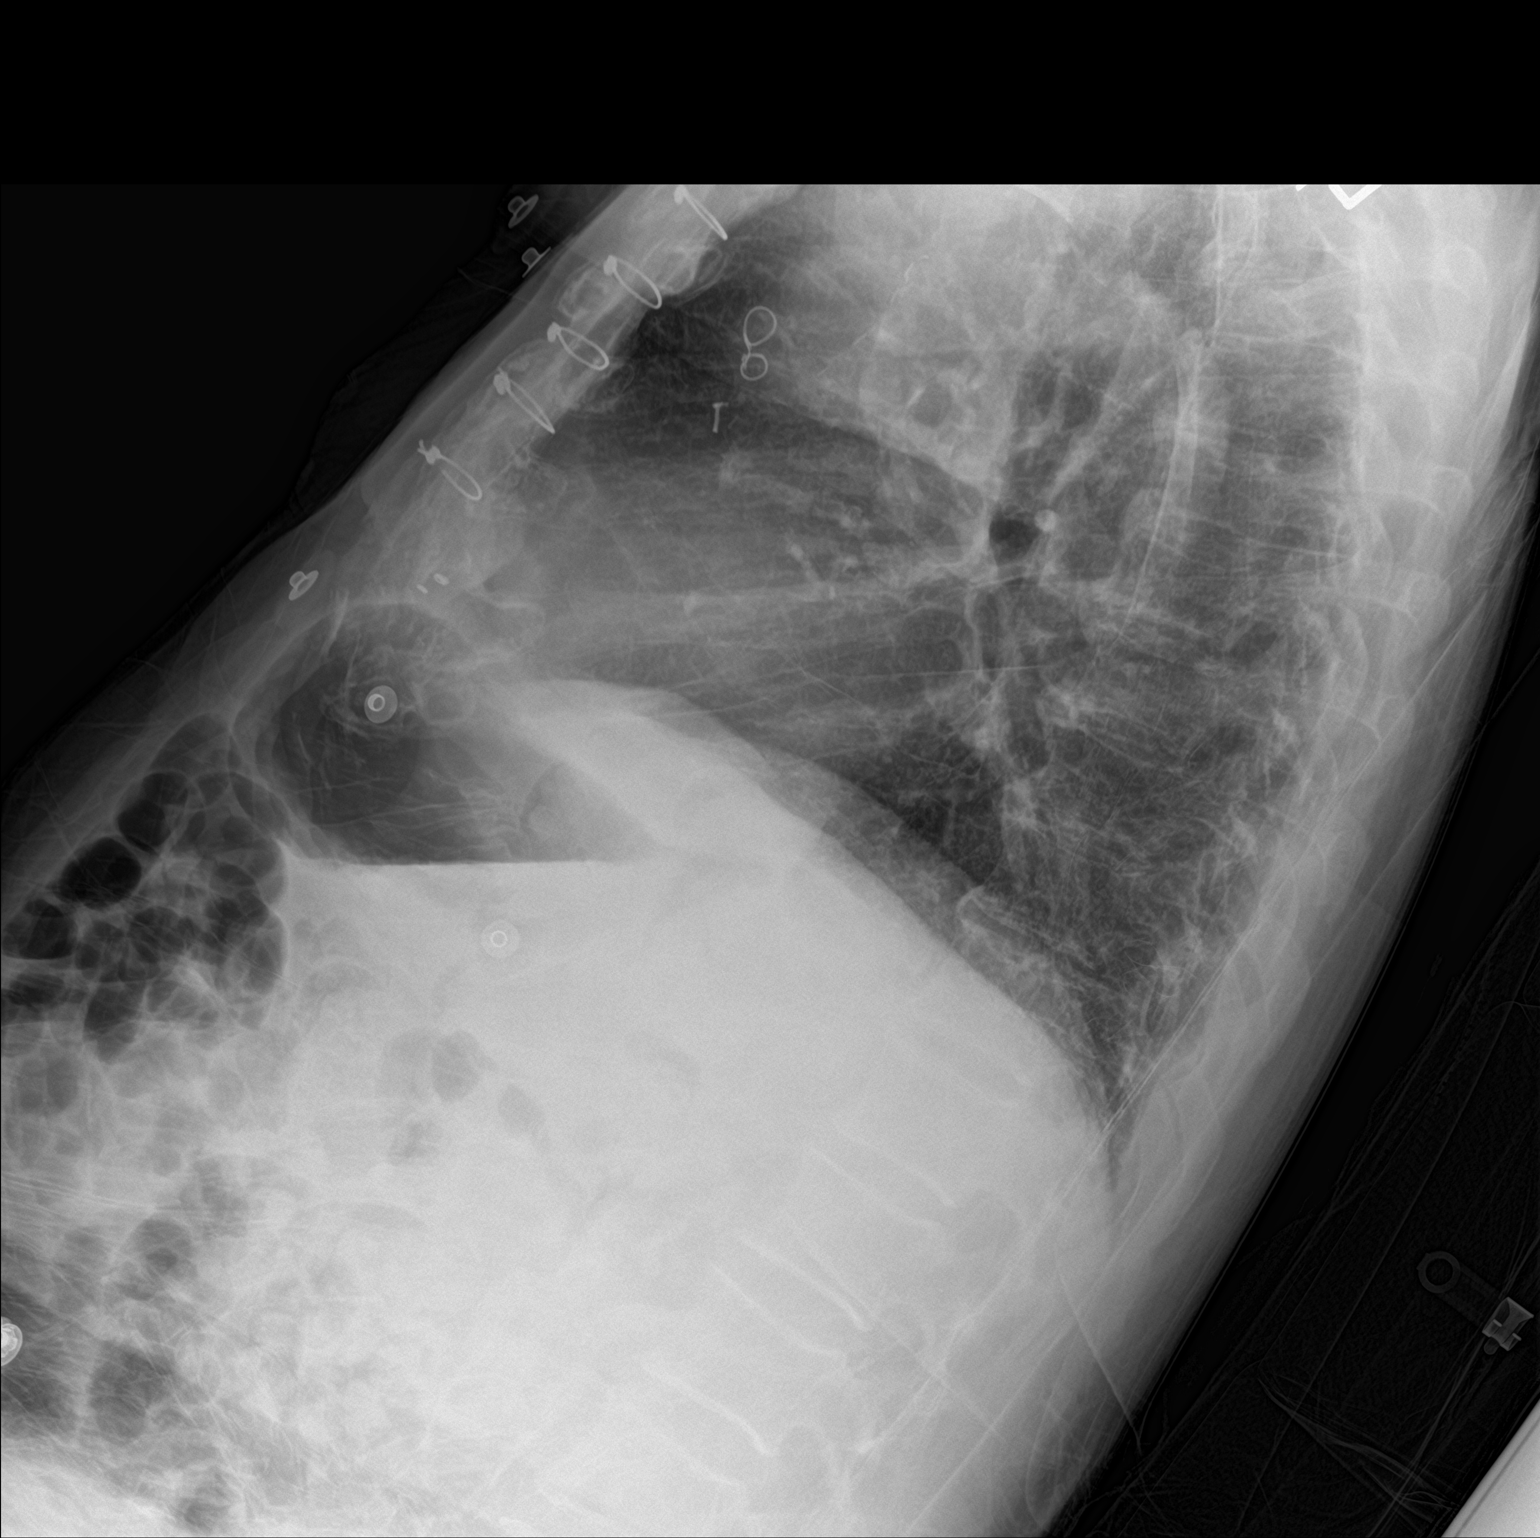

[chest ap]
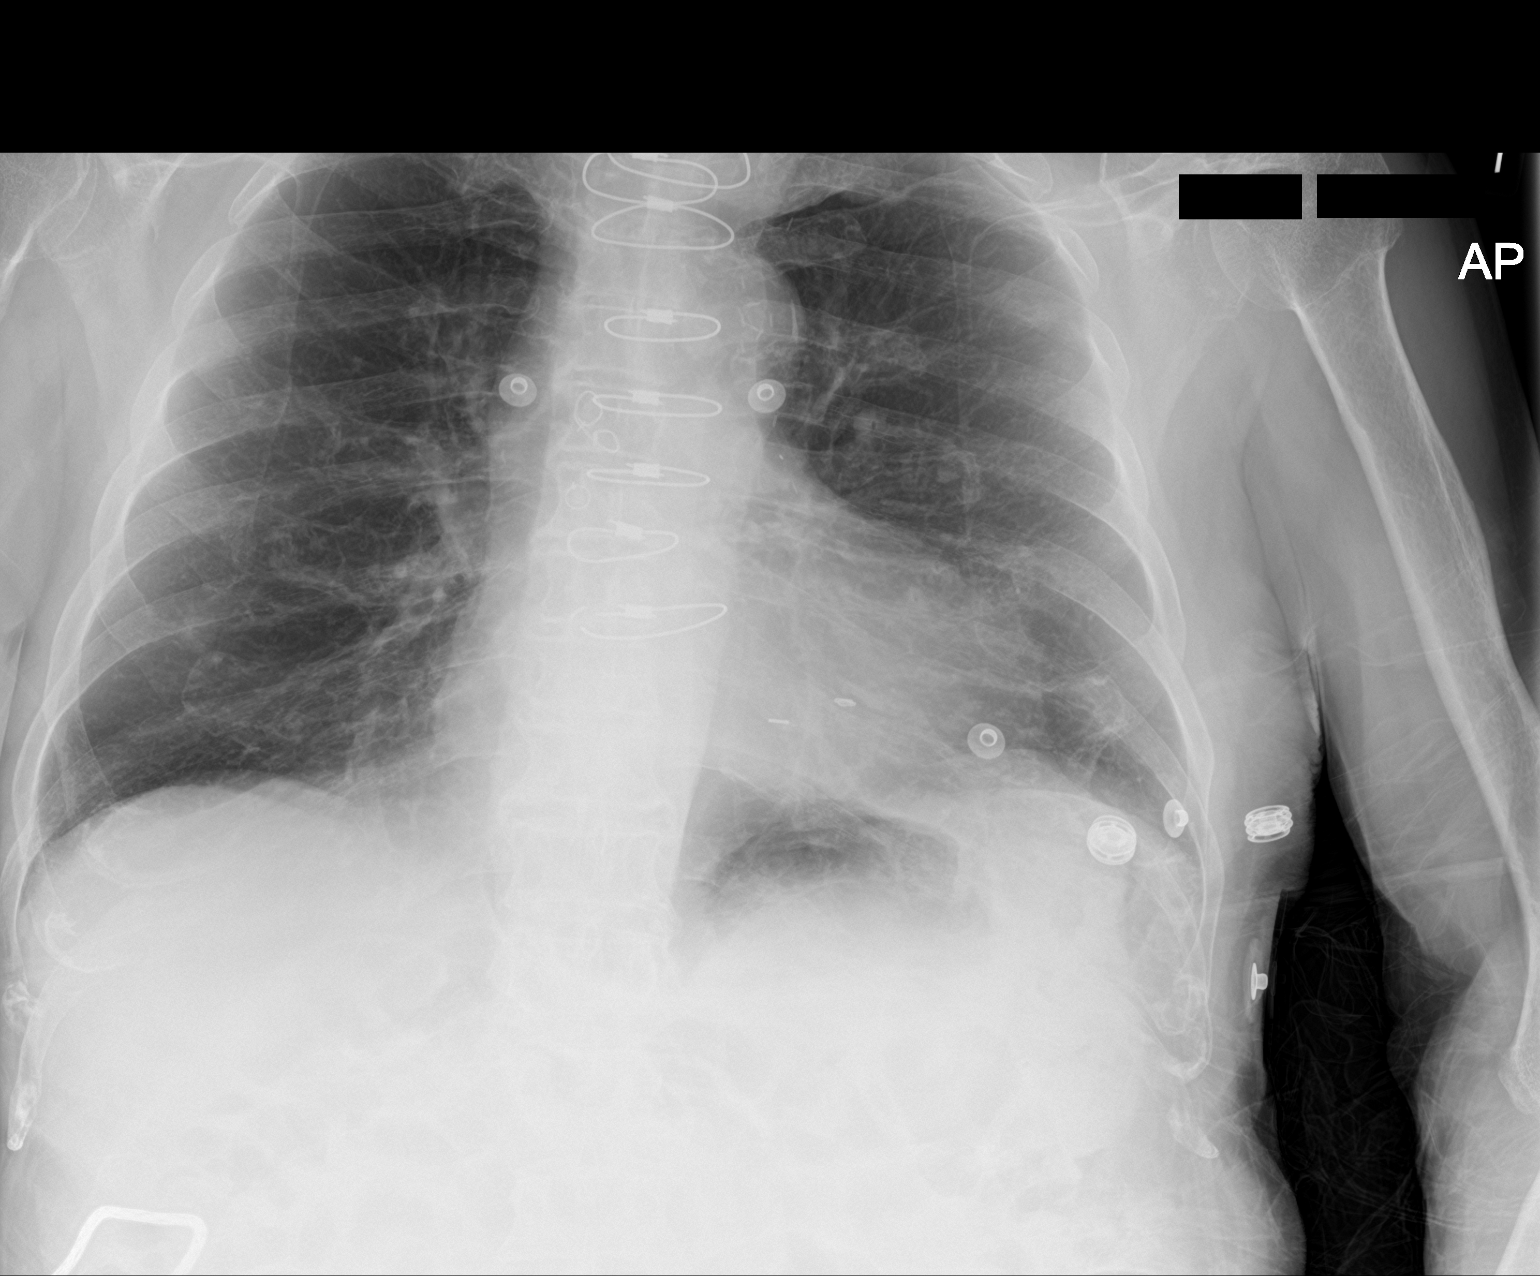

[2 of 2 positions shown; findings below may reference images not displayed]

FINDINGS: Unchanged cardiomegaly. Bibasilar linear opacities, likely
atelectasis. No pleural effusion or pneumothorax. No pulmonary
edema.
IMPRESSION: Unchanged cardiomegaly without pulmonary edema. Bilateral basilar
predominant linear opacities, likely atelectasis.
